# Patient Record
Sex: Female | Born: 2000 | Race: White | Hispanic: No | Marital: Single | Smoking: Never smoker
Health system: Southern US, Community
[De-identification: ages and names within clinical notes are randomized; demographics above are authoritative.]

## PROBLEM LIST (undated history)

## (undated) DIAGNOSIS — F419 Anxiety disorder, unspecified: Secondary | ICD-10-CM

## (undated) HISTORY — PX: NO PAST SURGERIES: SHX2092

---
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
## 2020-02-08 DIAGNOSIS — Z79899 Other long term (current) drug therapy: Secondary | ICD-10-CM | POA: Diagnosis not present

## 2020-02-08 DIAGNOSIS — L700 Acne vulgaris: Secondary | ICD-10-CM | POA: Diagnosis not present

## 2020-03-11 DIAGNOSIS — L700 Acne vulgaris: Secondary | ICD-10-CM | POA: Diagnosis not present

## 2020-03-11 DIAGNOSIS — Z79899 Other long term (current) drug therapy: Secondary | ICD-10-CM | POA: Diagnosis not present

## 2020-03-20 DIAGNOSIS — M25572 Pain in left ankle and joints of left foot: Secondary | ICD-10-CM | POA: Diagnosis not present

## 2020-04-11 DIAGNOSIS — L700 Acne vulgaris: Secondary | ICD-10-CM | POA: Diagnosis not present

## 2020-04-11 DIAGNOSIS — M25472 Effusion, left ankle: Secondary | ICD-10-CM | POA: Diagnosis not present

## 2020-04-11 DIAGNOSIS — Z79899 Other long term (current) drug therapy: Secondary | ICD-10-CM | POA: Diagnosis not present

## 2020-04-23 DIAGNOSIS — Z20822 Contact with and (suspected) exposure to covid-19: Secondary | ICD-10-CM | POA: Diagnosis not present

## 2020-04-27 DIAGNOSIS — Z03818 Encounter for observation for suspected exposure to other biological agents ruled out: Secondary | ICD-10-CM | POA: Diagnosis not present

## 2020-04-30 DIAGNOSIS — Z03818 Encounter for observation for suspected exposure to other biological agents ruled out: Secondary | ICD-10-CM | POA: Diagnosis not present

## 2020-04-30 DIAGNOSIS — Z20822 Contact with and (suspected) exposure to covid-19: Secondary | ICD-10-CM | POA: Diagnosis not present

## 2020-05-13 DIAGNOSIS — L700 Acne vulgaris: Secondary | ICD-10-CM | POA: Diagnosis not present

## 2020-05-13 DIAGNOSIS — Z79899 Other long term (current) drug therapy: Secondary | ICD-10-CM | POA: Diagnosis not present

## 2020-06-18 ENCOUNTER — Other Ambulatory Visit (HOSPITAL_COMMUNITY): Payer: Self-pay | Admitting: General Surgery

## 2020-06-18 DIAGNOSIS — L700 Acne vulgaris: Secondary | ICD-10-CM | POA: Diagnosis not present

## 2020-06-18 DIAGNOSIS — Z79899 Other long term (current) drug therapy: Secondary | ICD-10-CM | POA: Diagnosis not present

## 2020-06-18 MED FILL — MYORISAN 30 MG CAPSULE: 30 | 30 days supply | Qty: 60 | Fill #0

## 2020-07-18 ENCOUNTER — Other Ambulatory Visit (HOSPITAL_COMMUNITY): Payer: Self-pay | Admitting: Dermatology

## 2020-07-18 DIAGNOSIS — L700 Acne vulgaris: Secondary | ICD-10-CM | POA: Diagnosis not present

## 2020-07-18 DIAGNOSIS — Z79899 Other long term (current) drug therapy: Secondary | ICD-10-CM | POA: Diagnosis not present

## 2020-07-19 MED FILL — MYORISAN 30 MG CAPSULE: 30 | 30 days supply | Qty: 60 | Fill #0

## 2020-08-15 DIAGNOSIS — Z20822 Contact with and (suspected) exposure to covid-19: Secondary | ICD-10-CM | POA: Diagnosis not present

## 2020-08-20 ENCOUNTER — Other Ambulatory Visit (HOSPITAL_COMMUNITY): Payer: Self-pay

## 2020-08-20 DIAGNOSIS — Z79899 Other long term (current) drug therapy: Secondary | ICD-10-CM | POA: Diagnosis not present

## 2020-08-20 DIAGNOSIS — L700 Acne vulgaris: Secondary | ICD-10-CM | POA: Diagnosis not present

## 2020-08-20 MED ORDER — ISOTRETINOIN 30 MG PO CAPS
60.0000 mg | ORAL_CAPSULE | Freq: Every day | ORAL | 0 refills | Status: AC
  Filled 2020-08-21: qty 60, 30d supply, fill #0

## 2020-08-21 ENCOUNTER — Other Ambulatory Visit (HOSPITAL_COMMUNITY): Payer: Self-pay

## 2020-09-19 DIAGNOSIS — Z79899 Other long term (current) drug therapy: Secondary | ICD-10-CM | POA: Diagnosis not present

## 2020-09-19 DIAGNOSIS — L700 Acne vulgaris: Secondary | ICD-10-CM | POA: Diagnosis not present

## 2020-09-20 DIAGNOSIS — Z20822 Contact with and (suspected) exposure to covid-19: Secondary | ICD-10-CM | POA: Diagnosis not present

## 2020-10-10 DIAGNOSIS — Z20822 Contact with and (suspected) exposure to covid-19: Secondary | ICD-10-CM | POA: Diagnosis not present

## 2020-11-07 DIAGNOSIS — Z30431 Encounter for routine checking of intrauterine contraceptive device: Secondary | ICD-10-CM | POA: Diagnosis not present

## 2020-11-27 ENCOUNTER — Other Ambulatory Visit (HOSPITAL_COMMUNITY): Payer: Self-pay

## 2020-11-27 MED ORDER — SPIRONOLACTONE 50 MG PO TABS
50.0000 mg | ORAL_TABLET | Freq: Every day | ORAL | 0 refills | Status: DC
  Filled 2020-11-27: qty 30, 30d supply, fill #0

## 2020-12-24 DIAGNOSIS — L700 Acne vulgaris: Secondary | ICD-10-CM | POA: Diagnosis not present

## 2021-04-02 DIAGNOSIS — M25552 Pain in left hip: Secondary | ICD-10-CM | POA: Diagnosis not present

## 2021-04-05 DIAGNOSIS — M25552 Pain in left hip: Secondary | ICD-10-CM | POA: Diagnosis not present

## 2021-05-20 DIAGNOSIS — M25852 Other specified joint disorders, left hip: Secondary | ICD-10-CM | POA: Diagnosis not present

## 2021-05-27 DIAGNOSIS — M6281 Muscle weakness (generalized): Secondary | ICD-10-CM | POA: Diagnosis not present

## 2021-05-27 DIAGNOSIS — M25552 Pain in left hip: Secondary | ICD-10-CM | POA: Diagnosis not present

## 2021-05-27 DIAGNOSIS — G8929 Other chronic pain: Secondary | ICD-10-CM | POA: Diagnosis not present

## 2021-05-27 DIAGNOSIS — R1032 Left lower quadrant pain: Secondary | ICD-10-CM | POA: Diagnosis not present

## 2021-06-03 DIAGNOSIS — R1032 Left lower quadrant pain: Secondary | ICD-10-CM | POA: Diagnosis not present

## 2021-06-03 DIAGNOSIS — M6281 Muscle weakness (generalized): Secondary | ICD-10-CM | POA: Diagnosis not present

## 2021-06-03 DIAGNOSIS — M25552 Pain in left hip: Secondary | ICD-10-CM | POA: Diagnosis not present

## 2021-06-03 DIAGNOSIS — G8929 Other chronic pain: Secondary | ICD-10-CM | POA: Diagnosis not present

## 2021-06-17 DIAGNOSIS — G8929 Other chronic pain: Secondary | ICD-10-CM | POA: Diagnosis not present

## 2021-06-17 DIAGNOSIS — M25552 Pain in left hip: Secondary | ICD-10-CM | POA: Diagnosis not present

## 2021-06-17 DIAGNOSIS — R1032 Left lower quadrant pain: Secondary | ICD-10-CM | POA: Diagnosis not present

## 2021-06-17 DIAGNOSIS — M6281 Muscle weakness (generalized): Secondary | ICD-10-CM | POA: Diagnosis not present

## 2021-07-04 DIAGNOSIS — Z23 Encounter for immunization: Secondary | ICD-10-CM | POA: Diagnosis not present

## 2021-07-04 DIAGNOSIS — L700 Acne vulgaris: Secondary | ICD-10-CM | POA: Diagnosis not present

## 2021-07-15 DIAGNOSIS — M25552 Pain in left hip: Secondary | ICD-10-CM | POA: Diagnosis not present

## 2021-07-15 DIAGNOSIS — R1032 Left lower quadrant pain: Secondary | ICD-10-CM | POA: Diagnosis not present

## 2021-07-15 DIAGNOSIS — G8929 Other chronic pain: Secondary | ICD-10-CM | POA: Diagnosis not present

## 2021-07-15 DIAGNOSIS — M6281 Muscle weakness (generalized): Secondary | ICD-10-CM | POA: Diagnosis not present

## 2021-07-21 DIAGNOSIS — M25852 Other specified joint disorders, left hip: Secondary | ICD-10-CM | POA: Diagnosis not present

## 2021-08-07 ENCOUNTER — Other Ambulatory Visit: Payer: Self-pay | Admitting: Nurse Practitioner

## 2021-08-07 ENCOUNTER — Other Ambulatory Visit (HOSPITAL_COMMUNITY)
Admission: RE | Admit: 2021-08-07 | Discharge: 2021-08-07 | Disposition: A | Discharge status: Home / Self Care | Payer: 59 | Source: Ambulatory Visit | Attending: Nurse Practitioner | Admitting: Nurse Practitioner

## 2021-08-07 DIAGNOSIS — Z30431 Encounter for routine checking of intrauterine contraceptive device: Secondary | ICD-10-CM | POA: Diagnosis not present

## 2021-08-07 DIAGNOSIS — Z124 Encounter for screening for malignant neoplasm of cervix: Secondary | ICD-10-CM | POA: Insufficient documentation

## 2021-08-07 DIAGNOSIS — Z113 Encounter for screening for infections with a predominantly sexual mode of transmission: Secondary | ICD-10-CM | POA: Diagnosis not present

## 2021-08-07 DIAGNOSIS — Z01419 Encounter for gynecological examination (general) (routine) without abnormal findings: Secondary | ICD-10-CM | POA: Diagnosis not present

## 2021-08-12 DIAGNOSIS — S73192A Other sprain of left hip, initial encounter: Secondary | ICD-10-CM | POA: Diagnosis not present

## 2021-08-12 DIAGNOSIS — M25852 Other specified joint disorders, left hip: Secondary | ICD-10-CM | POA: Diagnosis not present

## 2021-08-12 DIAGNOSIS — S73102A Unspecified sprain of left hip, initial encounter: Secondary | ICD-10-CM | POA: Diagnosis not present

## 2021-08-12 DIAGNOSIS — M24852 Other specific joint derangements of left hip, not elsewhere classified: Secondary | ICD-10-CM | POA: Diagnosis not present

## 2021-08-12 DIAGNOSIS — M24152 Other articular cartilage disorders, left hip: Secondary | ICD-10-CM | POA: Diagnosis not present

## 2021-08-13 LAB — CYTOLOGY - PAP
Comment: NEGATIVE
Diagnosis: UNDETERMINED — AB
High risk HPV: NEGATIVE

## 2021-08-22 DIAGNOSIS — M25852 Other specified joint disorders, left hip: Secondary | ICD-10-CM | POA: Diagnosis not present

## 2021-08-27 DIAGNOSIS — Z4789 Encounter for other orthopedic aftercare: Secondary | ICD-10-CM | POA: Diagnosis not present

## 2021-08-27 DIAGNOSIS — M12552 Traumatic arthropathy, left hip: Secondary | ICD-10-CM | POA: Diagnosis not present

## 2021-08-27 DIAGNOSIS — M25552 Pain in left hip: Secondary | ICD-10-CM | POA: Diagnosis not present

## 2021-08-29 DIAGNOSIS — M25552 Pain in left hip: Secondary | ICD-10-CM | POA: Diagnosis not present

## 2021-08-29 DIAGNOSIS — M12552 Traumatic arthropathy, left hip: Secondary | ICD-10-CM | POA: Diagnosis not present

## 2021-08-29 DIAGNOSIS — Z4789 Encounter for other orthopedic aftercare: Secondary | ICD-10-CM | POA: Diagnosis not present

## 2021-09-03 DIAGNOSIS — Z4789 Encounter for other orthopedic aftercare: Secondary | ICD-10-CM | POA: Diagnosis not present

## 2021-09-03 DIAGNOSIS — M12552 Traumatic arthropathy, left hip: Secondary | ICD-10-CM | POA: Diagnosis not present

## 2021-09-03 DIAGNOSIS — M25552 Pain in left hip: Secondary | ICD-10-CM | POA: Diagnosis not present

## 2021-09-05 DIAGNOSIS — M25552 Pain in left hip: Secondary | ICD-10-CM | POA: Diagnosis not present

## 2021-09-05 DIAGNOSIS — M12552 Traumatic arthropathy, left hip: Secondary | ICD-10-CM | POA: Diagnosis not present

## 2021-09-05 DIAGNOSIS — Z4789 Encounter for other orthopedic aftercare: Secondary | ICD-10-CM | POA: Diagnosis not present

## 2021-09-08 DIAGNOSIS — M25552 Pain in left hip: Secondary | ICD-10-CM | POA: Diagnosis not present

## 2021-09-08 DIAGNOSIS — Z4789 Encounter for other orthopedic aftercare: Secondary | ICD-10-CM | POA: Diagnosis not present

## 2021-09-08 DIAGNOSIS — M12552 Traumatic arthropathy, left hip: Secondary | ICD-10-CM | POA: Diagnosis not present

## 2021-09-12 DIAGNOSIS — Z4789 Encounter for other orthopedic aftercare: Secondary | ICD-10-CM | POA: Diagnosis not present

## 2021-09-12 DIAGNOSIS — M12552 Traumatic arthropathy, left hip: Secondary | ICD-10-CM | POA: Diagnosis not present

## 2021-09-12 DIAGNOSIS — M25552 Pain in left hip: Secondary | ICD-10-CM | POA: Diagnosis not present

## 2021-09-19 DIAGNOSIS — M12552 Traumatic arthropathy, left hip: Secondary | ICD-10-CM | POA: Diagnosis not present

## 2021-09-19 DIAGNOSIS — Z4789 Encounter for other orthopedic aftercare: Secondary | ICD-10-CM | POA: Diagnosis not present

## 2021-09-19 DIAGNOSIS — M25552 Pain in left hip: Secondary | ICD-10-CM | POA: Diagnosis not present

## 2021-09-23 DIAGNOSIS — Z4789 Encounter for other orthopedic aftercare: Secondary | ICD-10-CM | POA: Diagnosis not present

## 2021-09-23 DIAGNOSIS — M12552 Traumatic arthropathy, left hip: Secondary | ICD-10-CM | POA: Diagnosis not present

## 2021-09-23 DIAGNOSIS — M25552 Pain in left hip: Secondary | ICD-10-CM | POA: Diagnosis not present

## 2021-09-30 DIAGNOSIS — Z4789 Encounter for other orthopedic aftercare: Secondary | ICD-10-CM | POA: Diagnosis not present

## 2021-09-30 DIAGNOSIS — M25552 Pain in left hip: Secondary | ICD-10-CM | POA: Diagnosis not present

## 2021-09-30 DIAGNOSIS — M12552 Traumatic arthropathy, left hip: Secondary | ICD-10-CM | POA: Diagnosis not present

## 2021-10-02 DIAGNOSIS — M25552 Pain in left hip: Secondary | ICD-10-CM | POA: Diagnosis not present

## 2021-10-02 DIAGNOSIS — Z4789 Encounter for other orthopedic aftercare: Secondary | ICD-10-CM | POA: Diagnosis not present

## 2021-10-02 DIAGNOSIS — M12552 Traumatic arthropathy, left hip: Secondary | ICD-10-CM | POA: Diagnosis not present

## 2021-10-13 DIAGNOSIS — R1032 Left lower quadrant pain: Secondary | ICD-10-CM | POA: Diagnosis not present

## 2021-10-13 DIAGNOSIS — G8929 Other chronic pain: Secondary | ICD-10-CM | POA: Diagnosis not present

## 2021-10-13 DIAGNOSIS — M25552 Pain in left hip: Secondary | ICD-10-CM | POA: Diagnosis not present

## 2021-10-13 DIAGNOSIS — M6281 Muscle weakness (generalized): Secondary | ICD-10-CM | POA: Diagnosis not present

## 2021-10-13 DIAGNOSIS — M25652 Stiffness of left hip, not elsewhere classified: Secondary | ICD-10-CM | POA: Diagnosis not present

## 2021-10-23 DIAGNOSIS — M25552 Pain in left hip: Secondary | ICD-10-CM | POA: Diagnosis not present

## 2021-10-23 DIAGNOSIS — M25652 Stiffness of left hip, not elsewhere classified: Secondary | ICD-10-CM | POA: Diagnosis not present

## 2021-10-23 DIAGNOSIS — M6281 Muscle weakness (generalized): Secondary | ICD-10-CM | POA: Diagnosis not present

## 2021-10-23 DIAGNOSIS — G8929 Other chronic pain: Secondary | ICD-10-CM | POA: Diagnosis not present

## 2021-10-23 DIAGNOSIS — R1032 Left lower quadrant pain: Secondary | ICD-10-CM | POA: Diagnosis not present

## 2021-10-28 DIAGNOSIS — M6281 Muscle weakness (generalized): Secondary | ICD-10-CM | POA: Diagnosis not present

## 2021-10-28 DIAGNOSIS — G8929 Other chronic pain: Secondary | ICD-10-CM | POA: Diagnosis not present

## 2021-10-28 DIAGNOSIS — M25552 Pain in left hip: Secondary | ICD-10-CM | POA: Diagnosis not present

## 2021-10-28 DIAGNOSIS — R1032 Left lower quadrant pain: Secondary | ICD-10-CM | POA: Diagnosis not present

## 2021-10-28 DIAGNOSIS — M25652 Stiffness of left hip, not elsewhere classified: Secondary | ICD-10-CM | POA: Diagnosis not present

## 2021-11-04 DIAGNOSIS — M25652 Stiffness of left hip, not elsewhere classified: Secondary | ICD-10-CM | POA: Diagnosis not present

## 2021-11-04 DIAGNOSIS — G8929 Other chronic pain: Secondary | ICD-10-CM | POA: Diagnosis not present

## 2021-11-04 DIAGNOSIS — M25552 Pain in left hip: Secondary | ICD-10-CM | POA: Diagnosis not present

## 2021-11-04 DIAGNOSIS — R1032 Left lower quadrant pain: Secondary | ICD-10-CM | POA: Diagnosis not present

## 2021-11-04 DIAGNOSIS — M6281 Muscle weakness (generalized): Secondary | ICD-10-CM | POA: Diagnosis not present

## 2021-11-06 DIAGNOSIS — G8929 Other chronic pain: Secondary | ICD-10-CM | POA: Diagnosis not present

## 2021-11-06 DIAGNOSIS — M25652 Stiffness of left hip, not elsewhere classified: Secondary | ICD-10-CM | POA: Diagnosis not present

## 2021-11-06 DIAGNOSIS — M6281 Muscle weakness (generalized): Secondary | ICD-10-CM | POA: Diagnosis not present

## 2021-11-06 DIAGNOSIS — M25552 Pain in left hip: Secondary | ICD-10-CM | POA: Diagnosis not present

## 2021-11-06 DIAGNOSIS — R1032 Left lower quadrant pain: Secondary | ICD-10-CM | POA: Diagnosis not present

## 2021-11-13 DIAGNOSIS — Z4789 Encounter for other orthopedic aftercare: Secondary | ICD-10-CM | POA: Diagnosis not present

## 2021-11-13 DIAGNOSIS — M12552 Traumatic arthropathy, left hip: Secondary | ICD-10-CM | POA: Diagnosis not present

## 2021-11-13 DIAGNOSIS — M25552 Pain in left hip: Secondary | ICD-10-CM | POA: Diagnosis not present

## 2021-11-25 DIAGNOSIS — Z4789 Encounter for other orthopedic aftercare: Secondary | ICD-10-CM | POA: Diagnosis not present

## 2021-11-25 DIAGNOSIS — M12552 Traumatic arthropathy, left hip: Secondary | ICD-10-CM | POA: Diagnosis not present

## 2021-11-25 DIAGNOSIS — M25552 Pain in left hip: Secondary | ICD-10-CM | POA: Diagnosis not present

## 2021-12-02 DIAGNOSIS — Z4789 Encounter for other orthopedic aftercare: Secondary | ICD-10-CM | POA: Diagnosis not present

## 2021-12-02 DIAGNOSIS — M25552 Pain in left hip: Secondary | ICD-10-CM | POA: Diagnosis not present

## 2021-12-02 DIAGNOSIS — M12552 Traumatic arthropathy, left hip: Secondary | ICD-10-CM | POA: Diagnosis not present

## 2021-12-09 DIAGNOSIS — M12552 Traumatic arthropathy, left hip: Secondary | ICD-10-CM | POA: Diagnosis not present

## 2021-12-09 DIAGNOSIS — M25552 Pain in left hip: Secondary | ICD-10-CM | POA: Diagnosis not present

## 2021-12-09 DIAGNOSIS — Z4789 Encounter for other orthopedic aftercare: Secondary | ICD-10-CM | POA: Diagnosis not present

## 2022-02-04 DIAGNOSIS — Z0000 Encounter for general adult medical examination without abnormal findings: Secondary | ICD-10-CM | POA: Diagnosis not present

## 2022-02-04 DIAGNOSIS — F419 Anxiety disorder, unspecified: Secondary | ICD-10-CM | POA: Diagnosis not present

## 2022-02-06 DIAGNOSIS — F411 Generalized anxiety disorder: Secondary | ICD-10-CM | POA: Diagnosis not present

## 2022-02-06 DIAGNOSIS — F410 Panic disorder [episodic paroxysmal anxiety] without agoraphobia: Secondary | ICD-10-CM | POA: Diagnosis not present

## 2022-02-11 DIAGNOSIS — Z0000 Encounter for general adult medical examination without abnormal findings: Secondary | ICD-10-CM | POA: Diagnosis not present

## 2022-02-13 DIAGNOSIS — F411 Generalized anxiety disorder: Secondary | ICD-10-CM | POA: Diagnosis not present

## 2022-02-13 DIAGNOSIS — F410 Panic disorder [episodic paroxysmal anxiety] without agoraphobia: Secondary | ICD-10-CM | POA: Diagnosis not present

## 2022-02-22 DIAGNOSIS — F411 Generalized anxiety disorder: Secondary | ICD-10-CM | POA: Diagnosis not present

## 2022-02-22 DIAGNOSIS — F410 Panic disorder [episodic paroxysmal anxiety] without agoraphobia: Secondary | ICD-10-CM | POA: Diagnosis not present

## 2022-02-27 DIAGNOSIS — F411 Generalized anxiety disorder: Secondary | ICD-10-CM | POA: Diagnosis not present

## 2022-02-27 DIAGNOSIS — F410 Panic disorder [episodic paroxysmal anxiety] without agoraphobia: Secondary | ICD-10-CM | POA: Diagnosis not present

## 2022-03-06 DIAGNOSIS — F410 Panic disorder [episodic paroxysmal anxiety] without agoraphobia: Secondary | ICD-10-CM | POA: Diagnosis not present

## 2022-03-06 DIAGNOSIS — F411 Generalized anxiety disorder: Secondary | ICD-10-CM | POA: Diagnosis not present

## 2022-03-20 DIAGNOSIS — F410 Panic disorder [episodic paroxysmal anxiety] without agoraphobia: Secondary | ICD-10-CM | POA: Diagnosis not present

## 2022-03-20 DIAGNOSIS — F411 Generalized anxiety disorder: Secondary | ICD-10-CM | POA: Diagnosis not present

## 2022-03-27 DIAGNOSIS — F411 Generalized anxiety disorder: Secondary | ICD-10-CM | POA: Diagnosis not present

## 2022-03-27 DIAGNOSIS — F410 Panic disorder [episodic paroxysmal anxiety] without agoraphobia: Secondary | ICD-10-CM | POA: Diagnosis not present

## 2022-04-01 ENCOUNTER — Other Ambulatory Visit (HOSPITAL_COMMUNITY): Payer: Self-pay

## 2022-04-03 DIAGNOSIS — F410 Panic disorder [episodic paroxysmal anxiety] without agoraphobia: Secondary | ICD-10-CM | POA: Diagnosis not present

## 2022-04-03 DIAGNOSIS — F411 Generalized anxiety disorder: Secondary | ICD-10-CM | POA: Diagnosis not present

## 2022-04-19 DIAGNOSIS — F410 Panic disorder [episodic paroxysmal anxiety] without agoraphobia: Secondary | ICD-10-CM | POA: Diagnosis not present

## 2022-04-19 DIAGNOSIS — F411 Generalized anxiety disorder: Secondary | ICD-10-CM | POA: Diagnosis not present

## 2022-04-24 DIAGNOSIS — F411 Generalized anxiety disorder: Secondary | ICD-10-CM | POA: Diagnosis not present

## 2022-04-24 DIAGNOSIS — F410 Panic disorder [episodic paroxysmal anxiety] without agoraphobia: Secondary | ICD-10-CM | POA: Diagnosis not present

## 2022-05-01 DIAGNOSIS — F410 Panic disorder [episodic paroxysmal anxiety] without agoraphobia: Secondary | ICD-10-CM | POA: Diagnosis not present

## 2022-05-01 DIAGNOSIS — F411 Generalized anxiety disorder: Secondary | ICD-10-CM | POA: Diagnosis not present

## 2022-05-08 DIAGNOSIS — F411 Generalized anxiety disorder: Secondary | ICD-10-CM | POA: Diagnosis not present

## 2022-05-08 DIAGNOSIS — F410 Panic disorder [episodic paroxysmal anxiety] without agoraphobia: Secondary | ICD-10-CM | POA: Diagnosis not present

## 2022-05-15 DIAGNOSIS — F411 Generalized anxiety disorder: Secondary | ICD-10-CM | POA: Diagnosis not present

## 2022-05-15 DIAGNOSIS — F410 Panic disorder [episodic paroxysmal anxiety] without agoraphobia: Secondary | ICD-10-CM | POA: Diagnosis not present

## 2022-05-22 DIAGNOSIS — F411 Generalized anxiety disorder: Secondary | ICD-10-CM | POA: Diagnosis not present

## 2022-05-22 DIAGNOSIS — F410 Panic disorder [episodic paroxysmal anxiety] without agoraphobia: Secondary | ICD-10-CM | POA: Diagnosis not present

## 2022-05-29 DIAGNOSIS — F410 Panic disorder [episodic paroxysmal anxiety] without agoraphobia: Secondary | ICD-10-CM | POA: Diagnosis not present

## 2022-05-29 DIAGNOSIS — F411 Generalized anxiety disorder: Secondary | ICD-10-CM | POA: Diagnosis not present

## 2022-06-05 DIAGNOSIS — F411 Generalized anxiety disorder: Secondary | ICD-10-CM | POA: Diagnosis not present

## 2022-06-05 DIAGNOSIS — F410 Panic disorder [episodic paroxysmal anxiety] without agoraphobia: Secondary | ICD-10-CM | POA: Diagnosis not present

## 2022-06-12 DIAGNOSIS — F411 Generalized anxiety disorder: Secondary | ICD-10-CM | POA: Diagnosis not present

## 2022-06-12 DIAGNOSIS — F410 Panic disorder [episodic paroxysmal anxiety] without agoraphobia: Secondary | ICD-10-CM | POA: Diagnosis not present

## 2022-06-19 DIAGNOSIS — F411 Generalized anxiety disorder: Secondary | ICD-10-CM | POA: Diagnosis not present

## 2022-06-19 DIAGNOSIS — F410 Panic disorder [episodic paroxysmal anxiety] without agoraphobia: Secondary | ICD-10-CM | POA: Diagnosis not present

## 2022-06-26 DIAGNOSIS — F411 Generalized anxiety disorder: Secondary | ICD-10-CM | POA: Diagnosis not present

## 2022-06-26 DIAGNOSIS — F410 Panic disorder [episodic paroxysmal anxiety] without agoraphobia: Secondary | ICD-10-CM | POA: Diagnosis not present

## 2022-07-03 DIAGNOSIS — F411 Generalized anxiety disorder: Secondary | ICD-10-CM | POA: Diagnosis not present

## 2022-07-03 DIAGNOSIS — F410 Panic disorder [episodic paroxysmal anxiety] without agoraphobia: Secondary | ICD-10-CM | POA: Diagnosis not present

## 2022-07-17 DIAGNOSIS — F411 Generalized anxiety disorder: Secondary | ICD-10-CM | POA: Diagnosis not present

## 2022-07-17 DIAGNOSIS — F410 Panic disorder [episodic paroxysmal anxiety] without agoraphobia: Secondary | ICD-10-CM | POA: Diagnosis not present

## 2022-07-24 DIAGNOSIS — F410 Panic disorder [episodic paroxysmal anxiety] without agoraphobia: Secondary | ICD-10-CM | POA: Diagnosis not present

## 2022-07-24 DIAGNOSIS — F411 Generalized anxiety disorder: Secondary | ICD-10-CM | POA: Diagnosis not present

## 2022-07-31 DIAGNOSIS — F411 Generalized anxiety disorder: Secondary | ICD-10-CM | POA: Diagnosis not present

## 2022-07-31 DIAGNOSIS — F410 Panic disorder [episodic paroxysmal anxiety] without agoraphobia: Secondary | ICD-10-CM | POA: Diagnosis not present

## 2022-08-07 DIAGNOSIS — F410 Panic disorder [episodic paroxysmal anxiety] without agoraphobia: Secondary | ICD-10-CM | POA: Diagnosis not present

## 2022-08-07 DIAGNOSIS — F411 Generalized anxiety disorder: Secondary | ICD-10-CM | POA: Diagnosis not present

## 2022-08-21 DIAGNOSIS — F410 Panic disorder [episodic paroxysmal anxiety] without agoraphobia: Secondary | ICD-10-CM | POA: Diagnosis not present

## 2022-08-21 DIAGNOSIS — F411 Generalized anxiety disorder: Secondary | ICD-10-CM | POA: Diagnosis not present

## 2022-08-28 DIAGNOSIS — F410 Panic disorder [episodic paroxysmal anxiety] without agoraphobia: Secondary | ICD-10-CM | POA: Diagnosis not present

## 2022-08-28 DIAGNOSIS — F411 Generalized anxiety disorder: Secondary | ICD-10-CM | POA: Diagnosis not present

## 2022-09-11 DIAGNOSIS — F411 Generalized anxiety disorder: Secondary | ICD-10-CM | POA: Diagnosis not present

## 2022-09-11 DIAGNOSIS — F410 Panic disorder [episodic paroxysmal anxiety] without agoraphobia: Secondary | ICD-10-CM | POA: Diagnosis not present

## 2022-09-18 DIAGNOSIS — F410 Panic disorder [episodic paroxysmal anxiety] without agoraphobia: Secondary | ICD-10-CM | POA: Diagnosis not present

## 2022-09-18 DIAGNOSIS — F411 Generalized anxiety disorder: Secondary | ICD-10-CM | POA: Diagnosis not present

## 2022-09-25 DIAGNOSIS — F410 Panic disorder [episodic paroxysmal anxiety] without agoraphobia: Secondary | ICD-10-CM | POA: Diagnosis not present

## 2022-09-25 DIAGNOSIS — F411 Generalized anxiety disorder: Secondary | ICD-10-CM | POA: Diagnosis not present

## 2022-10-02 DIAGNOSIS — F410 Panic disorder [episodic paroxysmal anxiety] without agoraphobia: Secondary | ICD-10-CM | POA: Diagnosis not present

## 2022-10-02 DIAGNOSIS — F411 Generalized anxiety disorder: Secondary | ICD-10-CM | POA: Diagnosis not present

## 2022-10-09 DIAGNOSIS — F411 Generalized anxiety disorder: Secondary | ICD-10-CM | POA: Diagnosis not present

## 2022-10-09 DIAGNOSIS — F410 Panic disorder [episodic paroxysmal anxiety] without agoraphobia: Secondary | ICD-10-CM | POA: Diagnosis not present

## 2022-10-16 DIAGNOSIS — F411 Generalized anxiety disorder: Secondary | ICD-10-CM | POA: Diagnosis not present

## 2022-10-16 DIAGNOSIS — F410 Panic disorder [episodic paroxysmal anxiety] without agoraphobia: Secondary | ICD-10-CM | POA: Diagnosis not present

## 2022-10-30 DIAGNOSIS — F411 Generalized anxiety disorder: Secondary | ICD-10-CM | POA: Diagnosis not present

## 2022-10-30 DIAGNOSIS — F410 Panic disorder [episodic paroxysmal anxiety] without agoraphobia: Secondary | ICD-10-CM | POA: Diagnosis not present

## 2022-11-06 DIAGNOSIS — F411 Generalized anxiety disorder: Secondary | ICD-10-CM | POA: Diagnosis not present

## 2022-11-06 DIAGNOSIS — F410 Panic disorder [episodic paroxysmal anxiety] without agoraphobia: Secondary | ICD-10-CM | POA: Diagnosis not present

## 2022-11-24 DIAGNOSIS — F411 Generalized anxiety disorder: Secondary | ICD-10-CM | POA: Diagnosis not present

## 2022-11-24 DIAGNOSIS — F410 Panic disorder [episodic paroxysmal anxiety] without agoraphobia: Secondary | ICD-10-CM | POA: Diagnosis not present

## 2022-12-01 DIAGNOSIS — F411 Generalized anxiety disorder: Secondary | ICD-10-CM | POA: Diagnosis not present

## 2022-12-01 DIAGNOSIS — F410 Panic disorder [episodic paroxysmal anxiety] without agoraphobia: Secondary | ICD-10-CM | POA: Diagnosis not present

## 2022-12-08 DIAGNOSIS — F411 Generalized anxiety disorder: Secondary | ICD-10-CM | POA: Diagnosis not present

## 2022-12-08 DIAGNOSIS — F410 Panic disorder [episodic paroxysmal anxiety] without agoraphobia: Secondary | ICD-10-CM | POA: Diagnosis not present

## 2022-12-15 DIAGNOSIS — F410 Panic disorder [episodic paroxysmal anxiety] without agoraphobia: Secondary | ICD-10-CM | POA: Diagnosis not present

## 2022-12-15 DIAGNOSIS — F411 Generalized anxiety disorder: Secondary | ICD-10-CM | POA: Diagnosis not present

## 2022-12-29 DIAGNOSIS — F410 Panic disorder [episodic paroxysmal anxiety] without agoraphobia: Secondary | ICD-10-CM | POA: Diagnosis not present

## 2022-12-29 DIAGNOSIS — F411 Generalized anxiety disorder: Secondary | ICD-10-CM | POA: Diagnosis not present

## 2023-01-05 DIAGNOSIS — F410 Panic disorder [episodic paroxysmal anxiety] without agoraphobia: Secondary | ICD-10-CM | POA: Diagnosis not present

## 2023-01-05 DIAGNOSIS — F411 Generalized anxiety disorder: Secondary | ICD-10-CM | POA: Diagnosis not present

## 2023-01-19 DIAGNOSIS — F410 Panic disorder [episodic paroxysmal anxiety] without agoraphobia: Secondary | ICD-10-CM | POA: Diagnosis not present

## 2023-01-19 DIAGNOSIS — F411 Generalized anxiety disorder: Secondary | ICD-10-CM | POA: Diagnosis not present

## 2023-01-26 DIAGNOSIS — F410 Panic disorder [episodic paroxysmal anxiety] without agoraphobia: Secondary | ICD-10-CM | POA: Diagnosis not present

## 2023-01-26 DIAGNOSIS — F411 Generalized anxiety disorder: Secondary | ICD-10-CM | POA: Diagnosis not present

## 2023-02-02 DIAGNOSIS — F410 Panic disorder [episodic paroxysmal anxiety] without agoraphobia: Secondary | ICD-10-CM | POA: Diagnosis not present

## 2023-02-02 DIAGNOSIS — F411 Generalized anxiety disorder: Secondary | ICD-10-CM | POA: Diagnosis not present

## 2023-02-09 DIAGNOSIS — F410 Panic disorder [episodic paroxysmal anxiety] without agoraphobia: Secondary | ICD-10-CM | POA: Diagnosis not present

## 2023-02-09 DIAGNOSIS — F411 Generalized anxiety disorder: Secondary | ICD-10-CM | POA: Diagnosis not present

## 2023-02-16 DIAGNOSIS — F410 Panic disorder [episodic paroxysmal anxiety] without agoraphobia: Secondary | ICD-10-CM | POA: Diagnosis not present

## 2023-02-16 DIAGNOSIS — F411 Generalized anxiety disorder: Secondary | ICD-10-CM | POA: Diagnosis not present

## 2023-02-23 DIAGNOSIS — F410 Panic disorder [episodic paroxysmal anxiety] without agoraphobia: Secondary | ICD-10-CM | POA: Diagnosis not present

## 2023-02-23 DIAGNOSIS — F411 Generalized anxiety disorder: Secondary | ICD-10-CM | POA: Diagnosis not present

## 2023-03-02 DIAGNOSIS — F410 Panic disorder [episodic paroxysmal anxiety] without agoraphobia: Secondary | ICD-10-CM | POA: Diagnosis not present

## 2023-03-02 DIAGNOSIS — F411 Generalized anxiety disorder: Secondary | ICD-10-CM | POA: Diagnosis not present

## 2023-03-09 DIAGNOSIS — F410 Panic disorder [episodic paroxysmal anxiety] without agoraphobia: Secondary | ICD-10-CM | POA: Diagnosis not present

## 2023-03-09 DIAGNOSIS — F411 Generalized anxiety disorder: Secondary | ICD-10-CM | POA: Diagnosis not present

## 2023-03-16 DIAGNOSIS — F410 Panic disorder [episodic paroxysmal anxiety] without agoraphobia: Secondary | ICD-10-CM | POA: Diagnosis not present

## 2023-03-16 DIAGNOSIS — F411 Generalized anxiety disorder: Secondary | ICD-10-CM | POA: Diagnosis not present

## 2023-03-23 DIAGNOSIS — F411 Generalized anxiety disorder: Secondary | ICD-10-CM | POA: Diagnosis not present

## 2023-03-23 DIAGNOSIS — F410 Panic disorder [episodic paroxysmal anxiety] without agoraphobia: Secondary | ICD-10-CM | POA: Diagnosis not present

## 2023-03-24 DIAGNOSIS — R0602 Shortness of breath: Secondary | ICD-10-CM | POA: Diagnosis not present

## 2023-03-24 DIAGNOSIS — R5381 Other malaise: Secondary | ICD-10-CM | POA: Diagnosis not present

## 2023-03-24 DIAGNOSIS — E785 Hyperlipidemia, unspecified: Secondary | ICD-10-CM | POA: Diagnosis not present

## 2023-03-24 DIAGNOSIS — R0600 Dyspnea, unspecified: Secondary | ICD-10-CM | POA: Diagnosis not present

## 2023-03-24 DIAGNOSIS — R5383 Other fatigue: Secondary | ICD-10-CM | POA: Diagnosis not present

## 2023-03-24 DIAGNOSIS — R7309 Other abnormal glucose: Secondary | ICD-10-CM | POA: Diagnosis not present

## 2023-03-24 DIAGNOSIS — M6281 Muscle weakness (generalized): Secondary | ICD-10-CM | POA: Diagnosis not present

## 2023-03-30 DIAGNOSIS — F411 Generalized anxiety disorder: Secondary | ICD-10-CM | POA: Diagnosis not present

## 2023-03-30 DIAGNOSIS — F410 Panic disorder [episodic paroxysmal anxiety] without agoraphobia: Secondary | ICD-10-CM | POA: Diagnosis not present

## 2023-03-31 DIAGNOSIS — Z111 Encounter for screening for respiratory tuberculosis: Secondary | ICD-10-CM | POA: Diagnosis not present

## 2023-04-29 DIAGNOSIS — F411 Generalized anxiety disorder: Secondary | ICD-10-CM | POA: Diagnosis not present

## 2023-04-29 DIAGNOSIS — F410 Panic disorder [episodic paroxysmal anxiety] without agoraphobia: Secondary | ICD-10-CM | POA: Diagnosis not present

## 2023-05-04 DIAGNOSIS — F410 Panic disorder [episodic paroxysmal anxiety] without agoraphobia: Secondary | ICD-10-CM | POA: Diagnosis not present

## 2023-05-04 DIAGNOSIS — F411 Generalized anxiety disorder: Secondary | ICD-10-CM | POA: Diagnosis not present

## 2023-05-11 DIAGNOSIS — F411 Generalized anxiety disorder: Secondary | ICD-10-CM | POA: Diagnosis not present

## 2023-05-11 DIAGNOSIS — F410 Panic disorder [episodic paroxysmal anxiety] without agoraphobia: Secondary | ICD-10-CM | POA: Diagnosis not present

## 2023-05-18 DIAGNOSIS — F411 Generalized anxiety disorder: Secondary | ICD-10-CM | POA: Diagnosis not present

## 2023-05-18 DIAGNOSIS — F410 Panic disorder [episodic paroxysmal anxiety] without agoraphobia: Secondary | ICD-10-CM | POA: Diagnosis not present

## 2023-05-25 DIAGNOSIS — F410 Panic disorder [episodic paroxysmal anxiety] without agoraphobia: Secondary | ICD-10-CM | POA: Diagnosis not present

## 2023-05-25 DIAGNOSIS — F411 Generalized anxiety disorder: Secondary | ICD-10-CM | POA: Diagnosis not present

## 2023-06-01 DIAGNOSIS — F411 Generalized anxiety disorder: Secondary | ICD-10-CM | POA: Diagnosis not present

## 2023-06-01 DIAGNOSIS — F410 Panic disorder [episodic paroxysmal anxiety] without agoraphobia: Secondary | ICD-10-CM | POA: Diagnosis not present

## 2023-06-15 DIAGNOSIS — F411 Generalized anxiety disorder: Secondary | ICD-10-CM | POA: Diagnosis not present

## 2023-06-15 DIAGNOSIS — F410 Panic disorder [episodic paroxysmal anxiety] without agoraphobia: Secondary | ICD-10-CM | POA: Diagnosis not present

## 2023-06-22 DIAGNOSIS — F410 Panic disorder [episodic paroxysmal anxiety] without agoraphobia: Secondary | ICD-10-CM | POA: Diagnosis not present

## 2023-06-22 DIAGNOSIS — F411 Generalized anxiety disorder: Secondary | ICD-10-CM | POA: Diagnosis not present

## 2023-06-29 DIAGNOSIS — F411 Generalized anxiety disorder: Secondary | ICD-10-CM | POA: Diagnosis not present

## 2023-06-29 DIAGNOSIS — F410 Panic disorder [episodic paroxysmal anxiety] without agoraphobia: Secondary | ICD-10-CM | POA: Diagnosis not present

## 2023-07-05 DIAGNOSIS — F429 Obsessive-compulsive disorder, unspecified: Secondary | ICD-10-CM | POA: Diagnosis not present

## 2023-07-05 DIAGNOSIS — F419 Anxiety disorder, unspecified: Secondary | ICD-10-CM | POA: Diagnosis not present

## 2023-07-06 DIAGNOSIS — F410 Panic disorder [episodic paroxysmal anxiety] without agoraphobia: Secondary | ICD-10-CM | POA: Diagnosis not present

## 2023-07-06 DIAGNOSIS — F411 Generalized anxiety disorder: Secondary | ICD-10-CM | POA: Diagnosis not present

## 2023-07-20 DIAGNOSIS — F411 Generalized anxiety disorder: Secondary | ICD-10-CM | POA: Diagnosis not present

## 2023-07-20 DIAGNOSIS — F410 Panic disorder [episodic paroxysmal anxiety] without agoraphobia: Secondary | ICD-10-CM | POA: Diagnosis not present

## 2023-07-27 DIAGNOSIS — F411 Generalized anxiety disorder: Secondary | ICD-10-CM | POA: Diagnosis not present

## 2023-07-27 DIAGNOSIS — F410 Panic disorder [episodic paroxysmal anxiety] without agoraphobia: Secondary | ICD-10-CM | POA: Diagnosis not present

## 2023-08-02 DIAGNOSIS — F429 Obsessive-compulsive disorder, unspecified: Secondary | ICD-10-CM | POA: Diagnosis not present

## 2023-08-02 DIAGNOSIS — F419 Anxiety disorder, unspecified: Secondary | ICD-10-CM | POA: Diagnosis not present

## 2023-08-03 DIAGNOSIS — F410 Panic disorder [episodic paroxysmal anxiety] without agoraphobia: Secondary | ICD-10-CM | POA: Diagnosis not present

## 2023-08-03 DIAGNOSIS — F411 Generalized anxiety disorder: Secondary | ICD-10-CM | POA: Diagnosis not present

## 2023-08-13 DIAGNOSIS — F411 Generalized anxiety disorder: Secondary | ICD-10-CM | POA: Diagnosis not present

## 2023-08-13 DIAGNOSIS — F410 Panic disorder [episodic paroxysmal anxiety] without agoraphobia: Secondary | ICD-10-CM | POA: Diagnosis not present

## 2023-08-17 DIAGNOSIS — F411 Generalized anxiety disorder: Secondary | ICD-10-CM | POA: Diagnosis not present

## 2023-08-17 DIAGNOSIS — F410 Panic disorder [episodic paroxysmal anxiety] without agoraphobia: Secondary | ICD-10-CM | POA: Diagnosis not present

## 2023-08-24 DIAGNOSIS — F411 Generalized anxiety disorder: Secondary | ICD-10-CM | POA: Diagnosis not present

## 2023-08-24 DIAGNOSIS — F410 Panic disorder [episodic paroxysmal anxiety] without agoraphobia: Secondary | ICD-10-CM | POA: Diagnosis not present

## 2023-08-30 DIAGNOSIS — F419 Anxiety disorder, unspecified: Secondary | ICD-10-CM | POA: Diagnosis not present

## 2023-08-30 DIAGNOSIS — F429 Obsessive-compulsive disorder, unspecified: Secondary | ICD-10-CM | POA: Diagnosis not present

## 2023-08-31 DIAGNOSIS — F410 Panic disorder [episodic paroxysmal anxiety] without agoraphobia: Secondary | ICD-10-CM | POA: Diagnosis not present

## 2023-08-31 DIAGNOSIS — F411 Generalized anxiety disorder: Secondary | ICD-10-CM | POA: Diagnosis not present

## 2023-09-14 DIAGNOSIS — F411 Generalized anxiety disorder: Secondary | ICD-10-CM | POA: Diagnosis not present

## 2023-09-14 DIAGNOSIS — F410 Panic disorder [episodic paroxysmal anxiety] without agoraphobia: Secondary | ICD-10-CM | POA: Diagnosis not present

## 2023-09-29 DIAGNOSIS — F410 Panic disorder [episodic paroxysmal anxiety] without agoraphobia: Secondary | ICD-10-CM | POA: Diagnosis not present

## 2023-09-29 DIAGNOSIS — F411 Generalized anxiety disorder: Secondary | ICD-10-CM | POA: Diagnosis not present

## 2023-10-06 DIAGNOSIS — F410 Panic disorder [episodic paroxysmal anxiety] without agoraphobia: Secondary | ICD-10-CM | POA: Diagnosis not present

## 2023-10-06 DIAGNOSIS — F411 Generalized anxiety disorder: Secondary | ICD-10-CM | POA: Diagnosis not present

## 2023-10-19 DIAGNOSIS — F410 Panic disorder [episodic paroxysmal anxiety] without agoraphobia: Secondary | ICD-10-CM | POA: Diagnosis not present

## 2023-10-19 DIAGNOSIS — F411 Generalized anxiety disorder: Secondary | ICD-10-CM | POA: Diagnosis not present

## 2023-10-26 DIAGNOSIS — F411 Generalized anxiety disorder: Secondary | ICD-10-CM | POA: Diagnosis not present

## 2023-10-26 DIAGNOSIS — F410 Panic disorder [episodic paroxysmal anxiety] without agoraphobia: Secondary | ICD-10-CM | POA: Diagnosis not present

## 2023-11-04 DIAGNOSIS — F410 Panic disorder [episodic paroxysmal anxiety] without agoraphobia: Secondary | ICD-10-CM | POA: Diagnosis not present

## 2023-11-04 DIAGNOSIS — F411 Generalized anxiety disorder: Secondary | ICD-10-CM | POA: Diagnosis not present

## 2023-11-09 DIAGNOSIS — F411 Generalized anxiety disorder: Secondary | ICD-10-CM | POA: Diagnosis not present

## 2023-11-09 DIAGNOSIS — F410 Panic disorder [episodic paroxysmal anxiety] without agoraphobia: Secondary | ICD-10-CM | POA: Diagnosis not present

## 2023-11-16 DIAGNOSIS — F411 Generalized anxiety disorder: Secondary | ICD-10-CM | POA: Diagnosis not present

## 2023-11-16 DIAGNOSIS — F410 Panic disorder [episodic paroxysmal anxiety] without agoraphobia: Secondary | ICD-10-CM | POA: Diagnosis not present

## 2023-11-23 DIAGNOSIS — F410 Panic disorder [episodic paroxysmal anxiety] without agoraphobia: Secondary | ICD-10-CM | POA: Diagnosis not present

## 2023-11-23 DIAGNOSIS — F429 Obsessive-compulsive disorder, unspecified: Secondary | ICD-10-CM | POA: Diagnosis not present

## 2023-11-23 DIAGNOSIS — F411 Generalized anxiety disorder: Secondary | ICD-10-CM | POA: Diagnosis not present

## 2023-12-07 DIAGNOSIS — F411 Generalized anxiety disorder: Secondary | ICD-10-CM | POA: Diagnosis not present

## 2023-12-07 DIAGNOSIS — F410 Panic disorder [episodic paroxysmal anxiety] without agoraphobia: Secondary | ICD-10-CM | POA: Diagnosis not present

## 2023-12-14 DIAGNOSIS — F410 Panic disorder [episodic paroxysmal anxiety] without agoraphobia: Secondary | ICD-10-CM | POA: Diagnosis not present

## 2023-12-14 DIAGNOSIS — F411 Generalized anxiety disorder: Secondary | ICD-10-CM | POA: Diagnosis not present

## 2023-12-23 DIAGNOSIS — F411 Generalized anxiety disorder: Secondary | ICD-10-CM | POA: Diagnosis not present

## 2023-12-23 DIAGNOSIS — F410 Panic disorder [episodic paroxysmal anxiety] without agoraphobia: Secondary | ICD-10-CM | POA: Diagnosis not present

## 2023-12-29 DIAGNOSIS — F411 Generalized anxiety disorder: Secondary | ICD-10-CM | POA: Diagnosis not present

## 2023-12-29 DIAGNOSIS — F410 Panic disorder [episodic paroxysmal anxiety] without agoraphobia: Secondary | ICD-10-CM | POA: Diagnosis not present

## 2024-01-06 DIAGNOSIS — F411 Generalized anxiety disorder: Secondary | ICD-10-CM | POA: Diagnosis not present

## 2024-01-06 DIAGNOSIS — F410 Panic disorder [episodic paroxysmal anxiety] without agoraphobia: Secondary | ICD-10-CM | POA: Diagnosis not present

## 2024-01-13 DIAGNOSIS — F411 Generalized anxiety disorder: Secondary | ICD-10-CM | POA: Diagnosis not present

## 2024-01-13 DIAGNOSIS — F410 Panic disorder [episodic paroxysmal anxiety] without agoraphobia: Secondary | ICD-10-CM | POA: Diagnosis not present

## 2024-01-19 DIAGNOSIS — F410 Panic disorder [episodic paroxysmal anxiety] without agoraphobia: Secondary | ICD-10-CM | POA: Diagnosis not present

## 2024-01-19 DIAGNOSIS — F411 Generalized anxiety disorder: Secondary | ICD-10-CM | POA: Diagnosis not present

## 2024-01-27 DIAGNOSIS — F411 Generalized anxiety disorder: Secondary | ICD-10-CM | POA: Diagnosis not present

## 2024-01-27 DIAGNOSIS — F410 Panic disorder [episodic paroxysmal anxiety] without agoraphobia: Secondary | ICD-10-CM | POA: Diagnosis not present

## 2024-02-03 DIAGNOSIS — F410 Panic disorder [episodic paroxysmal anxiety] without agoraphobia: Secondary | ICD-10-CM | POA: Diagnosis not present

## 2024-02-03 DIAGNOSIS — F411 Generalized anxiety disorder: Secondary | ICD-10-CM | POA: Diagnosis not present

## 2024-02-10 DIAGNOSIS — F411 Generalized anxiety disorder: Secondary | ICD-10-CM | POA: Diagnosis not present

## 2024-02-10 DIAGNOSIS — F410 Panic disorder [episodic paroxysmal anxiety] without agoraphobia: Secondary | ICD-10-CM | POA: Diagnosis not present

## 2024-02-17 DIAGNOSIS — F411 Generalized anxiety disorder: Secondary | ICD-10-CM | POA: Diagnosis not present

## 2024-02-17 DIAGNOSIS — F410 Panic disorder [episodic paroxysmal anxiety] without agoraphobia: Secondary | ICD-10-CM | POA: Diagnosis not present

## 2024-02-24 DIAGNOSIS — F411 Generalized anxiety disorder: Secondary | ICD-10-CM | POA: Diagnosis not present

## 2024-02-24 DIAGNOSIS — F410 Panic disorder [episodic paroxysmal anxiety] without agoraphobia: Secondary | ICD-10-CM | POA: Diagnosis not present

## 2024-03-02 DIAGNOSIS — F411 Generalized anxiety disorder: Secondary | ICD-10-CM | POA: Diagnosis not present

## 2024-03-02 DIAGNOSIS — F410 Panic disorder [episodic paroxysmal anxiety] without agoraphobia: Secondary | ICD-10-CM | POA: Diagnosis not present

## 2024-03-09 DIAGNOSIS — F411 Generalized anxiety disorder: Secondary | ICD-10-CM | POA: Diagnosis not present

## 2024-03-09 DIAGNOSIS — F410 Panic disorder [episodic paroxysmal anxiety] without agoraphobia: Secondary | ICD-10-CM | POA: Diagnosis not present

## 2024-03-23 DIAGNOSIS — F411 Generalized anxiety disorder: Secondary | ICD-10-CM | POA: Diagnosis not present

## 2024-03-23 DIAGNOSIS — F410 Panic disorder [episodic paroxysmal anxiety] without agoraphobia: Secondary | ICD-10-CM | POA: Diagnosis not present

## 2024-04-03 DIAGNOSIS — F411 Generalized anxiety disorder: Secondary | ICD-10-CM | POA: Diagnosis not present

## 2024-04-03 DIAGNOSIS — F32A Depression, unspecified: Secondary | ICD-10-CM | POA: Diagnosis not present

## 2024-04-03 DIAGNOSIS — F429 Obsessive-compulsive disorder, unspecified: Secondary | ICD-10-CM | POA: Diagnosis not present

## 2024-04-06 DIAGNOSIS — F411 Generalized anxiety disorder: Secondary | ICD-10-CM | POA: Diagnosis not present

## 2024-04-06 DIAGNOSIS — F410 Panic disorder [episodic paroxysmal anxiety] without agoraphobia: Secondary | ICD-10-CM | POA: Diagnosis not present
